# Patient Record
Sex: Female | Born: 1990 | Race: Black or African American | Hispanic: No | Marital: Single | Smoking: Never smoker
Health system: Southern US, Community
[De-identification: ages and names within clinical notes are randomized; demographics above are authoritative.]

---
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
## 2019-04-15 ENCOUNTER — Encounter (HOSPITAL_COMMUNITY): Payer: Self-pay | Admitting: Emergency Medicine

## 2019-04-15 ENCOUNTER — Other Ambulatory Visit: Payer: Self-pay

## 2019-04-15 ENCOUNTER — Emergency Department (HOSPITAL_COMMUNITY): Payer: Medicaid Other

## 2019-04-15 ENCOUNTER — Emergency Department (HOSPITAL_COMMUNITY)
Admission: EM | Admit: 2019-04-15 | Discharge: 2019-04-15 | Disposition: A | Discharge status: Home / Self Care | Payer: Medicaid Other | Attending: Emergency Medicine | Admitting: Emergency Medicine

## 2019-04-15 DIAGNOSIS — S0292XA Unspecified fracture of facial bones, initial encounter for closed fracture: Secondary | ICD-10-CM | POA: Insufficient documentation

## 2019-04-15 DIAGNOSIS — H5713 Ocular pain, bilateral: Secondary | ICD-10-CM | POA: Insufficient documentation

## 2019-04-15 DIAGNOSIS — H11422 Conjunctival edema, left eye: Secondary | ICD-10-CM | POA: Insufficient documentation

## 2019-04-15 DIAGNOSIS — Y929 Unspecified place or not applicable: Secondary | ICD-10-CM | POA: Insufficient documentation

## 2019-04-15 DIAGNOSIS — Y999 Unspecified external cause status: Secondary | ICD-10-CM | POA: Diagnosis not present

## 2019-04-15 DIAGNOSIS — Y939 Activity, unspecified: Secondary | ICD-10-CM | POA: Insufficient documentation

## 2019-04-15 DIAGNOSIS — M542 Cervicalgia: Secondary | ICD-10-CM | POA: Diagnosis not present

## 2019-04-15 DIAGNOSIS — R519 Headache, unspecified: Secondary | ICD-10-CM | POA: Diagnosis present

## 2019-04-15 MED ORDER — FLUORESCEIN SODIUM 1 MG OP STRP
1.0000 | ORAL_STRIP | Freq: Once | OPHTHALMIC | Status: DC
  Filled 2019-04-15: qty 1

## 2019-04-15 MED ORDER — TETRACAINE HCL 0.5 % OP SOLN
1.0000 [drp] | Freq: Once | OPHTHALMIC | Status: DC
  Filled 2019-04-15: qty 4

## 2019-04-15 NOTE — Discharge Instructions (Addendum)
Your CTs today showed a left nasal lacrimal bone fracture.  You need to avoid blowing your nose for the next 2 weeks.  We performed an eye exam in the ED which was within normal limits, you will need to schedule an appointment with ophthalmology along with ENT in order to obtain follow-up for this fracture.
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
## 2019-04-15 NOTE — ED Notes (Signed)
Pt discharge and follow up education provided. Pt verbalizes understanding. Pt is alert and oriented x 4 and ambulatory at discharge.  

## 2019-04-15 NOTE — ED Notes (Signed)
Pt transported to CT ?
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
## 2020-09-23 DIAGNOSIS — D649 Anemia, unspecified: Secondary | ICD-10-CM | POA: Insufficient documentation

## 2020-09-23 DIAGNOSIS — N939 Abnormal uterine and vaginal bleeding, unspecified: Secondary | ICD-10-CM | POA: Diagnosis present

## 2021-02-08 DIAGNOSIS — D500 Iron deficiency anemia secondary to blood loss (chronic): Secondary | ICD-10-CM | POA: Diagnosis present

## 2021-04-30 ENCOUNTER — Observation Stay (HOSPITAL_COMMUNITY)
Admission: EM | Admit: 2021-04-30 | Discharge: 2021-05-02 | Disposition: A | Discharge status: Home / Self Care | Payer: Medicaid Other | Attending: Internal Medicine | Admitting: Internal Medicine

## 2021-04-30 ENCOUNTER — Other Ambulatory Visit: Payer: Self-pay

## 2021-04-30 ENCOUNTER — Encounter (HOSPITAL_COMMUNITY): Payer: Self-pay | Admitting: Emergency Medicine

## 2021-04-30 DIAGNOSIS — Z20822 Contact with and (suspected) exposure to covid-19: Secondary | ICD-10-CM | POA: Insufficient documentation

## 2021-04-30 DIAGNOSIS — D649 Anemia, unspecified: Principal | ICD-10-CM | POA: Insufficient documentation

## 2021-04-30 DIAGNOSIS — D500 Iron deficiency anemia secondary to blood loss (chronic): Secondary | ICD-10-CM | POA: Diagnosis present

## 2021-04-30 DIAGNOSIS — D62 Acute posthemorrhagic anemia: Secondary | ICD-10-CM | POA: Diagnosis present

## 2021-04-30 DIAGNOSIS — R531 Weakness: Secondary | ICD-10-CM | POA: Diagnosis present

## 2021-04-30 DIAGNOSIS — N939 Abnormal uterine and vaginal bleeding, unspecified: Secondary | ICD-10-CM | POA: Diagnosis not present

## 2021-04-30 DIAGNOSIS — E876 Hypokalemia: Secondary | ICD-10-CM | POA: Insufficient documentation

## 2021-04-30 LAB — CBC
HCT: 14.9 % — ABNORMAL LOW (ref 36.0–46.0)
Hemoglobin: 4.4 g/dL — CL (ref 12.0–15.0)
MCH: 30.3 pg (ref 26.0–34.0)
MCHC: 29.5 g/dL — ABNORMAL LOW (ref 30.0–36.0)
MCV: 102.8 fL — ABNORMAL HIGH (ref 80.0–100.0)
Platelets: 145 10*3/uL — ABNORMAL LOW (ref 150–400)
RBC: 1.45 MIL/uL — ABNORMAL LOW (ref 3.87–5.11)
RDW: 23.1 % — ABNORMAL HIGH (ref 11.5–15.5)
WBC: 8 10*3/uL (ref 4.0–10.5)
nRBC: 1 % — ABNORMAL HIGH (ref 0.0–0.2)

## 2021-04-30 LAB — PREPARE RBC (CROSSMATCH)

## 2021-04-30 LAB — URINALYSIS, ROUTINE W REFLEX MICROSCOPIC

## 2021-04-30 LAB — BASIC METABOLIC PANEL
Anion gap: 5 (ref 5–15)
BUN: <5 mg/dL — ABNORMAL LOW (ref 6–20)
CO2: 24 mmol/L (ref 22–32)
Calcium: 8.2 mg/dL — ABNORMAL LOW (ref 8.9–10.3)
Chloride: 106 mmol/L (ref 98–111)
Creatinine, Ser: 0.78 mg/dL (ref 0.44–1.00)
GFR, Estimated: >60 mL/min (ref >60)
Glucose, Bld: 111 mg/dL — ABNORMAL HIGH (ref 70–99)
Potassium: 3.3 mmol/L — ABNORMAL LOW (ref 3.5–5.1)
Sodium: 135 mmol/L (ref 135–145)

## 2021-04-30 LAB — I-STAT BETA HCG BLOOD, ED (MC, WL, AP ONLY): I-stat hCG, quantitative: <5 m[IU]/mL (ref <5)

## 2021-04-30 LAB — RESP PANEL BY RT-PCR (FLU A&B, COVID) ARPGX2
Influenza A by PCR: NEGATIVE
Influenza B by PCR: NEGATIVE
SARS Coronavirus 2 by RT PCR: NEGATIVE

## 2021-04-30 LAB — URINALYSIS, MICROSCOPIC (REFLEX): RBC / HPF: >50 RBC/hpf (ref 0–5)

## 2021-04-30 LAB — ABO/RH: ABO/RH(D): B POS

## 2021-04-30 MED ORDER — ACETAMINOPHEN 325 MG PO TABS: 650.0000 mg | ORAL_TABLET | Freq: Four times a day (QID) | ORAL | Status: DC | PRN

## 2021-04-30 MED ORDER — POTASSIUM CHLORIDE CRYS ER 20 MEQ PO TBCR
40.0000 meq | EXTENDED_RELEASE_TABLET | Freq: Once | ORAL | Status: AC
  Administered 2021-05-01: 40 meq via ORAL
  Filled 2021-04-30: qty 2

## 2021-04-30 MED ORDER — ONDANSETRON HCL 4 MG/2ML IJ SOLN
4.0000 mg | Freq: Once | INTRAMUSCULAR | Status: AC
  Administered 2021-04-30: 4 mg via INTRAVENOUS
  Filled 2021-04-30: qty 2

## 2021-04-30 MED ORDER — POLYETHYLENE GLYCOL 3350 17 G PO PACK
17.0000 g | PACK | Freq: Every day | ORAL | Status: DC | PRN
  Administered 2021-05-01: 17 g via ORAL
  Filled 2021-04-30: qty 1

## 2021-04-30 MED ORDER — SODIUM CHLORIDE 0.9 % IV SOLN: 10.0000 mL/h | Freq: Once | INTRAVENOUS | Status: DC

## 2021-04-30 MED ORDER — MEGESTROL ACETATE 40 MG PO TABS
40.0000 mg | ORAL_TABLET | Freq: Two times a day (BID) | ORAL | Status: DC
  Administered 2021-05-01 – 2021-05-02 (×4): 40 mg via ORAL
  Filled 2021-04-30 (×5): qty 1

## 2021-04-30 MED ORDER — SODIUM CHLORIDE 0.9% FLUSH
3.0000 mL | Freq: Two times a day (BID) | INTRAVENOUS | Status: DC
  Administered 2021-05-01 (×2): 3 mL via INTRAVENOUS

## 2021-04-30 MED ORDER — ACETAMINOPHEN 650 MG RE SUPP: 650.0000 mg | Freq: Four times a day (QID) | RECTAL | Status: DC | PRN

## 2021-04-30 MED ORDER — ACETAMINOPHEN 500 MG PO TABS
1000.0000 mg | ORAL_TABLET | Freq: Once | ORAL | Status: AC
  Administered 2021-05-01: 1000 mg via ORAL
  Filled 2021-04-30: qty 2

## 2021-04-30 MED ORDER — ONDANSETRON HCL 4 MG/2ML IJ SOLN
4.0000 mg | Freq: Four times a day (QID) | INTRAMUSCULAR | Status: AC | PRN
  Administered 2021-05-01 (×2): 4 mg via INTRAVENOUS
  Filled 2021-04-30 (×2): qty 2

## 2021-04-30 NOTE — ED Triage Notes (Addendum)
Pt reported to ED for evaluation of symptoms including weakness, intermitted shortness of breath, nausea and hot flashes/chills and insomnia/restlessness after being prescribed medroxyprogesterone for vaginal bleeding. Pt states she was administered a blood transfusion for excessive bleeding a little over a week ago. Pt states bleeding has decreased since taking medication, but she cannot tolerate the side effects. Pallor noted, skin cool and dry.

## 2021-04-30 NOTE — H&P (Addendum)
History and Physical   Olivia Bruce VXB:[id number] DOB: Oct 06, 1990 DOA: 04/30/2021  PCP: Pcp, No   Patient coming from: Home  Chief Complaint: Multiple complaints, weakness and shortness of breath, history of anemia  HPI: Olivia Bruce is a 31 y.o. female with medical history significant of abnormal uterine bleeding, anemia presenting with weakness and shortness of breath at constellation of other symptoms.  Patient presenting with multiple days of weakness and shortness of breath as well as associated nausea, chills, hot flashes, insomnia, restlessness.  She associates most of the symptoms with recent prescription of medroxyprogesterone for her abnormal uterine bleeding.  She was recently seen for this at the [facility] ED where she was found to have, per chart review, hemoglobin of 7.7 on February 14, this improved on recheck on the 15th to 9.6.  Patient was discharged on the above medroxyprogesterone.  She feels like her bleeding has slowed some on this medication however worried about possible side effects if that is what is causing her symptoms.  She denies fevers, chest pain,abdominal pain, constipation, diarrhea.  ED Course: Vital signs in the ED significant for blood pressure in the 100s to 130s systolic, heart rate in the 90s to 100s, respiratory rate in the teens to 20s.  Lab work-up included BMP with potassium 3.3, calcium 8.2.  CBC showed hemoglobin of 4.4 and platelets of 145.  Respiratory panel for flu and COVID-negative.  Urinalysis was unable to be fully interpreted due to red pigment interference.  RBCs noted on microscopic analysis.  Patient was typed and screened in the ED.  2 units were ordered for transfusion and patient received Zofran and Tylenol.  OB/GYN was consulted who recommended Megace and consideration of transvaginal ultrasound.  Review of Systems: As per HPI otherwise all other systems reviewed and are negative.  History reviewed. No pertinent past medical  history.  History reviewed. No pertinent surgical history.  Social History  reports that she has never smoked. She has never used smokeless tobacco. She reports current alcohol use. She reports current drug use. Drug: Marijuana.  No Known Allergies  History reviewed. No pertinent family history.   Prior to Admission medications   Medication Sig Start Date End Date Taking? Authorizing Provider  acetaminophen (TYLENOL) 500 MG tablet Take 500 mg by mouth every 6 (six) hours as needed for mild pain.    [provider]    Physical Exam: Vitals:   04/30/21 2130 04/30/21 2145 04/30/21 2230 04/30/21 2245  BP: 114/68 120/69 (!) 104/52 (!) 111/58  Pulse: 99 (!) 101 87 93  Resp: 20 (!) 22 (!) 22 (!) 21  Temp:      TempSrc:      SpO2: 100% 100% 100% 100%  Weight:      Height:        Physical Exam Constitutional:      General: She is not in acute distress.    Appearance: Normal appearance.  HENT:     Head: Normocephalic and atraumatic.     Mouth/Throat:     Mouth: Mucous membranes are moist.     Pharynx: Oropharynx is clear.  Eyes:     Extraocular Movements: Extraocular movements intact.     Pupils: Pupils are equal, round, and reactive to light.  Cardiovascular:     Rate and Rhythm: Normal rate and regular rhythm.     Pulses: Normal pulses.     Heart sounds: Normal heart sounds.  Pulmonary:     Effort: Pulmonary effort is normal. No respiratory  distress.     Breath sounds: Normal breath sounds.  Abdominal:     General: Bowel sounds are normal. There is no distension.     Palpations: Abdomen is soft.     Tenderness: There is no abdominal tenderness.  Musculoskeletal:        General: No swelling or deformity.  Skin:    General: Skin is warm and dry.  Neurological:     General: No focal deficit present.     Mental Status: Mental status is at baseline.    Labs on Admission: I have personally reviewed following labs and imaging studies  CBC: Recent Labs  Lab  04/30/21 2008  WBC 8.0  HGB 4.4*  HCT 14.9*  MCV 102.8*  PLT 145*    Basic Metabolic Panel: Recent Labs  Lab 04/30/21 2008  NA 135  K 3.3*  CL 106  CO2 24  GLUCOSE 111*  BUN <5*  CREATININE 0.78  CALCIUM 8.2*    GFR: Estimated Creatinine Clearance: 98 mL/min (by C-G formula based on SCr of 0.78 mg/dL).  Liver Function Tests: No results for input(s): AST, ALT, ALKPHOS, BILITOT, PROT, ALBUMIN in the last 168 hours.  Urine analysis:    Component Value Date/Time   COLORURINE RED (A) 04/30/2021 1843   APPEARANCEUR TURBID (A) 04/30/2021 1843   LABSPEC  04/30/2021 1843    TEST NOT REPORTED DUE TO COLOR INTERFERENCE OF URINE PIGMENT   PHURINE  04/30/2021 1843    TEST NOT REPORTED DUE TO COLOR INTERFERENCE OF URINE PIGMENT   GLUCOSEU (A) 04/30/2021 1843    TEST NOT REPORTED DUE TO COLOR INTERFERENCE OF URINE PIGMENT   HGBUR (A) 04/30/2021 1843    TEST NOT REPORTED DUE TO COLOR INTERFERENCE OF URINE PIGMENT   BILIRUBINUR (A) 04/30/2021 1843    TEST NOT REPORTED DUE TO COLOR INTERFERENCE OF URINE PIGMENT   KETONESUR (A) 04/30/2021 1843    TEST NOT REPORTED DUE TO COLOR INTERFERENCE OF URINE PIGMENT   PROTEINUR (A) 04/30/2021 1843    TEST NOT REPORTED DUE TO COLOR INTERFERENCE OF URINE PIGMENT   NITRITE (A) 04/30/2021 1843    TEST NOT REPORTED DUE TO COLOR INTERFERENCE OF URINE PIGMENT   LEUKOCYTESUR (A) 04/30/2021 1843    TEST NOT REPORTED DUE TO COLOR INTERFERENCE OF URINE PIGMENT    Radiological Exams on Admission: No results found.  EKG: Independently reviewed.  Sinus tachycardia 105 bpm.  No previous to compare.  Assessment/Plan Principal Problem:   Symptomatic anemia Active Problems:   Abnormal uterine bleeding   Hypokalemia   Symptomatic anemia Abnormal uterine bleeding > Patient presenting with weakness and shortness of breath along with a constellation of other symptoms. > Recently seen at [facility] ED for abnormal uterine bleeding with hemoglobin of  7.7 which improved to 9.6 after transfusion of 2 units. > Discharged on medroxyprogesterone they are with recommendation to follow-up with OB/GYN, states her bleeding seems to have improved but was worried about side effects with symptoms she had been having which included weakness, shortness of breath, nausea, chills, hot flashes, insomnia, restless. > Found to have hemoglobin of 4.4 in the ED. > 2 units have been ordered for transfusion > OB/GYN has been consulted and recommend Megace and to consider vaginal/transabdominal ultrasound.  Their office will work to set her up for follow-up outpatient. - Appreciate recommendations left by OB/GYN, unclear if they plan to see the patient while here or does follow-up outpatient - Continue with 2 unit transfusion - Trend CBC,  likely need additional units of transfusion - Megace 40 mg twice daily - Transabdominal/vaginal ultrasound will compared to previous done in July description below (also available in care everywhere):  July Vaginal/Abdominal Ultrasound:  "Very thickened heterogeneous endometrium measuring 23.3 mm thick.  Endometrial tumor cannot be excluded. If bleeding remains  unresponsive to hormonal or medical therapy, focal lesion work-up  with sonohysterogram should be considered. Endometrial biopsy should  also be considered in pre-menopausal patients at high risk for  endometrial carcinoma."  Hypokalemia > Potassium noted to be 3.3 on admission - 40 mEq p.o. repletion - Check magnesium - Trend potassium  DVT prophylaxis: SCDs Code Status:   Full Family Communication:  , Potassium.  States her family is up-to-date. Disposition Plan:   Patient is from:  Home  Anticipated DC to:  Home  Anticipated DC date:  1 to 2 days  Anticipated DC barriers: None  Consults called:  OB/GYN consulted by EDP, gave recommendations, unclear if they will follow. Admission status:  Observation, progressive  Severity of Illness: The appropriate  patient status for this patient is OBSERVATION. Observation status is judged to be reasonable and necessary in order to provide the required intensity of service to ensure the patient's safety. The patient's presenting symptoms, physical exam findings, and initial radiographic and laboratory data in the context of their medical condition is felt to place them at decreased risk for further clinical deterioration. Furthermore, it is anticipated that the patient will be medically stable for discharge from the hospital within 2 midnights of admission.    Synetta Fail MD [facility]  How to contact the [facility] Attending or Consulting provider 7A - 7P or covering provider during after hours 7P -7A, for this patient?   Check the care team in [facility] and look for a) attending/consulting [facility] provider listed and b) the [facility] team listed Log into [email or web address] and use [place]'s universal password to access. If you do not have the password, please contact the hospital operator. Locate the [facility] provider you are looking for under [facility] and page to a number that you can be directly reached. If you still have difficulty reaching the provider, please page the [facility] (Director on Call) for the Hospitalists listed on amion for assistance.  05/01/2021, 12:01 AM

## 2021-04-30 NOTE — ED Provider Notes (Signed)
[facility] EMERGENCY DEPARTMENT Provider Note   CSN: [id number] Arrival date & time: 04/30/21  1843     History  Chief Complaint  Patient presents with   Weakness   Nausea    Olivia Bruce is a 31 y.o. female who presents to the ED today with complaint of gradual onset, constant, achy, headache for the past 1 week.  Patient also complains of nausea and shortness of breath as well as objective fevers and chills.  She states that she believes it is attributed to the medications that she was started on recently during previous ED visit on 2/14.  Per chart review she was seen at [facility] ED for dysfunctional uterine bleeding.  She had a hemoglobin of 7.1 at that time and received 2 units packed red blood cells.  She was started on Provera and advised to follow-up with OB/GYN.  Patient states that since starting the medication she has felt worse.  She does endorse that she is still having vaginal bleeding however it has decreased in amount.  She states that she is only using 1 large pad per day at this time.  She states that she was going through multiple pads last week.  She reports that she has had issues with this in the past.  She reports history of " excess tissue" in her uterus.  Per chart review patient had pelvic ultrasound done in July when she required a blood transfusion related to vaginal bleeding.  She was found to have a severely thickened endometrium at that time.   The history is provided by the patient and medical records.      Home Medications Prior to Admission medications   Medication Sig Start Date End Date Taking? Authorizing Provider  acetaminophen (TYLENOL) 500 MG tablet Take 500 mg by mouth every 6 (six) hours as needed for mild pain.    [provider]      Allergies    Patient has no known allergies.    Review of Systems   Review of Systems  Constitutional:  Positive for chills and fatigue.  Eyes:  Negative for visual disturbance.   Respiratory:  Positive for shortness of breath.   Cardiovascular:  Negative for chest pain.  Gastrointestinal:  Positive for nausea. Negative for vomiting.  Skin:  Positive for pallor.  Neurological:  Positive for headaches.  All other systems reviewed and are negative.  Physical Exam Updated Vital Signs BP (!) 111/58    Pulse 93    Temp 99.4 F (37.4 C) (Oral)    Resp (!) 21    Ht 5\' 3"  (1.6 m)    Wt 70.8 kg    SpO2 100%    BMI 27.63 kg/m  Physical Exam Vitals and nursing note reviewed.  Constitutional:      Appearance: She is not ill-appearing.  HENT:     Head: Normocephalic and atraumatic.  Eyes:     Extraocular Movements: Extraocular movements intact.     Conjunctiva/sclera: Conjunctivae normal.     Pupils: Pupils are equal, round, and reactive to light.  Cardiovascular:     Rate and Rhythm: Regular rhythm. Tachycardia present.  Pulmonary:     Effort: Pulmonary effort is normal.     Breath sounds: Normal breath sounds. No wheezing, rhonchi or rales.  Abdominal:     Palpations: Abdomen is soft.     Tenderness: There is no abdominal tenderness. There is no guarding or rebound.  Genitourinary:    Comments: Mild dark red/brown  blood in vault. No hemorrhage appreciated. No adnexal TTP or CMT on exam.  Musculoskeletal:     Cervical back: Neck supple.  Skin:    General: Skin is warm and dry.  Neurological:     General: No focal deficit present.     Mental Status: She is alert and oriented to person, place, and time.     Cranial Nerves: No cranial nerve deficit.     Motor: No weakness.    ED Results / Procedures / Treatments   Labs (all labs ordered are listed, but only abnormal results are displayed) Labs Reviewed  BASIC METABOLIC PANEL - Abnormal; Notable for the following components:      Result Value   Potassium 3.3 (*)    Glucose, Bld 111 (*)    BUN <5 (*)    Calcium 8.2 (*)    All other components within normal limits  CBC - Abnormal; Notable for the  following components:   RBC 1.45 (*)    Hemoglobin 4.4 (*)    HCT 14.9 (*)    MCV 102.8 (*)    MCHC 29.5 (*)    RDW 23.1 (*)    Platelets 145 (*)    nRBC 1.0 (*)    All other components within normal limits  URINALYSIS, ROUTINE W REFLEX MICROSCOPIC - Abnormal; Notable for the following components:   Color, Urine RED (*)    APPearance TURBID (*)    Glucose, UA   (*)    Value: TEST NOT REPORTED DUE TO COLOR INTERFERENCE OF URINE PIGMENT   Hgb urine dipstick   (*)    Value: TEST NOT REPORTED DUE TO COLOR INTERFERENCE OF URINE PIGMENT   Bilirubin Urine   (*)    Value: TEST NOT REPORTED DUE TO COLOR INTERFERENCE OF URINE PIGMENT   Ketones, ur   (*)    Value: TEST NOT REPORTED DUE TO COLOR INTERFERENCE OF URINE PIGMENT   Protein, ur   (*)    Value: TEST NOT REPORTED DUE TO COLOR INTERFERENCE OF URINE PIGMENT   Nitrite   (*)    Value: TEST NOT REPORTED DUE TO COLOR INTERFERENCE OF URINE PIGMENT   Leukocytes,Ua   (*)    Value: TEST NOT REPORTED DUE TO COLOR INTERFERENCE OF URINE PIGMENT   All other components within normal limits  URINALYSIS, MICROSCOPIC (REFLEX) - Abnormal; Notable for the following components:   Bacteria, UA FIELD OBSCURED BY RBC'S (*)    All other components within normal limits  RESP PANEL BY RT-PCR (FLU A&B, COVID) ARPGX2  HIV ANTIBODY (ROUTINE TESTING W REFLEX)  CBC  BASIC METABOLIC PANEL  MAGNESIUM  I-STAT BETA HCG BLOOD, ED ([facility], [facility], [facility] ONLY)  TYPE AND SCREEN  ABO/RH  PREPARE RBC (CROSSMATCH)    EKG None  Radiology No results found.  Procedures .Critical Care Performed by: Eustaquio Maize, PA-C Authorized by: Eustaquio Maize, PA-C   Critical care provider statement:    Critical care time (minutes):  35   Critical care was time spent personally by me on the following activities:  Development of treatment plan with patient or surrogate, discussions with consultants, evaluation of patient's response to treatment, examination of patient, ordering and  review of laboratory studies, ordering and review of radiographic studies, ordering and performing treatments and interventions, pulse oximetry, re-evaluation of patient's condition and review of old charts    Medications Ordered in ED Medications  0.9 %  sodium chloride infusion (has no administration in time range)  acetaminophen (  TYLENOL) tablet 1,000 mg (has no administration in time range)  sodium chloride flush (NS) 0.9 % injection 3 mL (has no administration in time range)  potassium chloride SA (KLOR-CON M) CR tablet 40 mEq (has no administration in time range)  acetaminophen (TYLENOL) tablet 650 mg (has no administration in time range)    Or  acetaminophen (TYLENOL) suppository 650 mg (has no administration in time range)  polyethylene glycol (MIRALAX / GLYCOLAX) packet 17 g (has no administration in time range)  megestrol (MEGACE) tablet 40 mg (has no administration in time range)  ondansetron (ZOFRAN) injection 4 mg (4 mg Intravenous Given 04/30/21 2143)    ED Course/ Medical Decision Making/ A&P Clinical Course as of 04/30/21 2332  Fri Apr 30, 2021  2326 Megace 40 mg bid  [MV]    Clinical Course User Index [MV] Eustaquio Maize, PA-C                           Medical Decision Making 31 year old female who presents to the ED today with complaint of headache, nausea, shortness of breath, fatigue, subjective fevers/chills over the past week since starting Provera secondary to heavy vaginal bleeding.  Received 2 units packed red blood cells last week at [facility] ED after hemoglobin returned at 7.1.  On arrival to the ED today patient is afebrile, mildly tachycardic in the low 110s, nontachypneic.  She does appear pale on exam at this time.  She has no abdominal tenderness palpation on exam.  She reports that she continues to bleed x5 weeks however it has slightly decreased in severity since starting Provera.  She has no focal neurodeficits on exam.  She had lab work done while in  the waiting room which did show a hemoglobin of 4.4.  Concerned that this is causing symptoms at this time however could consider medication reaction from starting Provera.  Ultimately patient will need to be admitted for transfusion.  We will plan for pelvic exam given vaginal bleeding and consultation with OB/GYN.  Problems Addressed: Abnormal uterine bleeding: acute illness or injury Symptomatic anemia: acute illness or injury  Amount and/or Complexity of Data Reviewed Labs: ordered. Discussion of management or test interpretation with external provider(s): Discussed case with [facility] Dr. Trilby Drummer who agrees to accept patient for admission.   Dr. Rip Harbour with OBGYN recommends 40 mg Megace BID instead of provera. Will get her set up in the outpatient setting with OBGYN. +/- pelvic ultrasound at the discretion of the hospitalist.   Risk OTC drugs. Prescription drug management. Decision regarding hospitalization.  Critical Care Total time providing critical care: 30-74 minutes         Final Clinical Impression(s) / ED Diagnoses Final diagnoses:  Symptomatic anemia  Abnormal uterine bleeding    Rx / DC Orders ED Discharge Orders     None         Eustaquio Maize, PA-C 04/30/21 2332    Tegeler, Gwenyth Allegra, MD 04/30/21 [phone]

## 2021-05-01 ENCOUNTER — Encounter (HOSPITAL_COMMUNITY): Payer: Self-pay | Admitting: Internal Medicine

## 2021-05-01 ENCOUNTER — Observation Stay (HOSPITAL_COMMUNITY): Payer: Medicaid Other

## 2021-05-01 DIAGNOSIS — N939 Abnormal uterine and vaginal bleeding, unspecified: Secondary | ICD-10-CM | POA: Diagnosis not present

## 2021-05-01 DIAGNOSIS — E876 Hypokalemia: Secondary | ICD-10-CM | POA: Diagnosis not present

## 2021-05-01 DIAGNOSIS — D500 Iron deficiency anemia secondary to blood loss (chronic): Secondary | ICD-10-CM

## 2021-05-01 DIAGNOSIS — D62 Acute posthemorrhagic anemia: Secondary | ICD-10-CM | POA: Diagnosis not present

## 2021-05-01 DIAGNOSIS — D649 Anemia, unspecified: Secondary | ICD-10-CM | POA: Diagnosis not present

## 2021-05-01 LAB — IRON AND TIBC
Iron: 191 ug/dL — ABNORMAL HIGH (ref 28–170)
Saturation Ratios: 79 % — ABNORMAL HIGH (ref 10.4–31.8)
TIBC: 242 ug/dL — ABNORMAL LOW (ref 250–450)
UIBC: 51 ug/dL

## 2021-05-01 LAB — BASIC METABOLIC PANEL
Anion gap: 6 (ref 5–15)
BUN: 5 mg/dL — ABNORMAL LOW (ref 6–20)
CO2: 22 mmol/L (ref 22–32)
Calcium: 7.9 mg/dL — ABNORMAL LOW (ref 8.9–10.3)
Chloride: 108 mmol/L (ref 98–111)
Creatinine, Ser: 1.03 mg/dL — ABNORMAL HIGH (ref 0.44–1.00)
GFR, Estimated: >60 mL/min (ref >60)
Glucose, Bld: 92 mg/dL (ref 70–99)
Potassium: 3.7 mmol/L (ref 3.5–5.1)
Sodium: 136 mmol/L (ref 135–145)

## 2021-05-01 LAB — RETICULOCYTES
Immature Retic Fract: 31.1 % — ABNORMAL HIGH (ref 2.3–15.9)
RBC.: 2.66 MIL/uL — ABNORMAL LOW (ref 3.87–5.11)
Retic Count, Absolute: 248.4 10*3/uL — ABNORMAL HIGH (ref 19.0–186.0)
Retic Ct Pct: 9.3 % — ABNORMAL HIGH (ref 0.4–3.1)

## 2021-05-01 LAB — HEMOGLOBIN AND HEMATOCRIT, BLOOD
HCT: 24.9 % — ABNORMAL LOW (ref 36.0–46.0)
Hemoglobin: 8.2 g/dL — ABNORMAL LOW (ref 12.0–15.0)

## 2021-05-01 LAB — HIV ANTIBODY (ROUTINE TESTING W REFLEX): HIV Screen 4th Generation wRfx: NONREACTIVE

## 2021-05-01 LAB — CBC
HCT: 17.8 % — ABNORMAL LOW (ref 36.0–46.0)
Hemoglobin: 5.4 g/dL — CL (ref 12.0–15.0)
MCH: 30.9 pg (ref 26.0–34.0)
MCHC: 30.3 g/dL (ref 30.0–36.0)
MCV: 101.7 fL — ABNORMAL HIGH (ref 80.0–100.0)
Platelets: 136 10*3/uL — ABNORMAL LOW (ref 150–400)
RBC: 1.75 MIL/uL — ABNORMAL LOW (ref 3.87–5.11)
RDW: 21.1 % — ABNORMAL HIGH (ref 11.5–15.5)
WBC: 6 10*3/uL (ref 4.0–10.5)
nRBC: 1.3 % — ABNORMAL HIGH (ref 0.0–0.2)

## 2021-05-01 LAB — FOLATE: Folate: 11.6 ng/mL (ref >5.9)

## 2021-05-01 LAB — PREPARE RBC (CROSSMATCH)

## 2021-05-01 LAB — MRSA NEXT GEN BY PCR, NASAL: MRSA by PCR Next Gen: NOT DETECTED

## 2021-05-01 LAB — FERRITIN: Ferritin: 55 ng/mL (ref 11–307)

## 2021-05-01 LAB — MAGNESIUM: Magnesium: 2.1 mg/dL (ref 1.7–2.4)

## 2021-05-01 LAB — VITAMIN B12: Vitamin B-12: 391 pg/mL (ref 180–914)

## 2021-05-01 MED ORDER — ONDANSETRON HCL 4 MG/2ML IJ SOLN
INTRAMUSCULAR | Status: AC
  Filled 2021-05-01: qty 2

## 2021-05-01 MED ORDER — SODIUM CHLORIDE 0.9% IV SOLUTION: Freq: Once | INTRAVENOUS | Status: AC

## 2021-05-01 NOTE — Assessment & Plan Note (Signed)
Transfuse to keep hemoglobin greater than 7, so far 3 units of PRBC transfusions ordered.  Repeat H&H tonight.

## 2021-05-01 NOTE — ED Notes (Signed)
Lunch tray at the bedside. Pt stated she want to rest at the moment

## 2021-05-01 NOTE — Assessment & Plan Note (Signed)
Anemia panel ordered. Transfuse to keep hemoglobin greater than 7.

## 2021-05-01 NOTE — Assessment & Plan Note (Signed)
Replaced, repeat in the morning

## 2021-05-01 NOTE — Assessment & Plan Note (Signed)
Secondary to uterine bleeding. OB/GYN consulted by EDP Trans vaginal ultrasound done and results reviewed with the patient S/p 3 units of PRBC transfusion and repeat H&H ordered for tonight. Transfuse to keep hemoglobin greater than 7. OB/GYN recommended Megace 40 mg twice daily and outpatient clinic follow-up.

## 2021-05-01 NOTE — TOC Initial Note (Signed)
Transition of Care [facility]) - Initial/Assessment Note    Patient Details  Name: Olivia Bruce MRN: [id number] Date of Birth: January 02, 1991  Transition of Care [facility]) CM/SW Contact:    Lockie Pares, RN Phone Number: 05/01/2021, 10:44 AM  Clinical Narrative:                  Transition of Care Department [facility]) has reviewed patient and no TOC needs have been identified at this time. We will continue to monitor patient advancement through interdisciplinary progression rounds. If new patient transition needs arise, please place a TOC consult.           Patient Goals and CMS Choice        Expected Discharge Plan and Services                                                Prior Living Arrangements/Services                       Activities of Daily Living      Permission Sought/Granted                  Emotional Assessment              Admission diagnosis:  Symptomatic anemia [D64.9] Patient Active Problem List   Diagnosis Date Noted   Symptomatic anemia 04/30/2021   Hypokalemia 04/30/2021   Iron deficiency anemia due to chronic blood loss 02/08/2021   Abnormal uterine bleeding 09/23/2020   Anemia 09/23/2020   PCP:  Pcp, No Pharmacy:   [facility] - [place], [place] - [address] [place] [address] Phone: [phone] Fax: [phone]     Social Determinants of Health (SDOH) Interventions    Readmission Risk Interventions No flowsheet data found.

## 2021-05-01 NOTE — ED Notes (Signed)
Pt ambulated to restroom with steady gait.

## 2021-05-01 NOTE — Hospital Course (Signed)
Olivia Bruce is a 31 y.o. female with medical history significant of abnormal uterine bleeding, anemia presenting with weakness and shortness of breath , was found to have hemoglobin of 4.4. she reports being seen  for this at the [facility] ED where she was found to have, per chart review, hemoglobin of 7.7 on February 14, this improved on recheck on the 15th to 9.6.  Patient was discharged on the above medroxyprogesterone.

## 2021-05-01 NOTE — Progress Notes (Signed)
HOSPITAL MEDICINE OVERNIGHT EVENT NOTE     Nursing has notified me that a CBC was performed in between the first and second unit of the patient's 2 unit blood transfusion.  This revealed that the hemoglobin rose from 4.4 to 5.4.  Furthermore, the patient has continued to exhibit a significant amount of bleeding.  It is very unlikely that 1 additional unit will achieve a hemoglobin of greater than 7 at this rate and therefore I am ordering a third unit of blood to be transfused after the second is complete.  We will additionally order repeat hemoglobin and hematocrit after this third unit is completed.  Nursing reports patient is hemodynamically stable with no chest pain or shortness of breath.  Continue to monitor closely.  Vernelle Emerald  MD [facility]

## 2021-05-01 NOTE — Progress Notes (Signed)
[facility]                                                                               Olivia Bruce, is a 31 y.o. female, DOB - 1990/09/26, EQA:[id number] Admit date - 04/30/2021    Outpatient Primary MD for the patient is Pcp, No  LOS - 0  days    Brief summary   Olivia Bruce is a 31 y.o. female with medical history significant of abnormal uterine bleeding, anemia presenting with weakness and shortness of breath , was found to have hemoglobin of 4.4. she reports being seen  for this at the [facility] ED where she was found to have, per chart review, hemoglobin of 7.7 on February 14, this improved on recheck on the 15th to 9.6.  Patient was discharged on the above medroxyprogesterone.     Assessment & Plan    Assessment and Plan: * Symptomatic anemia- (present on admission) Transfuse to keep hemoglobin greater than 7, so far 3 units of PRBC transfusions ordered.  Repeat H&H tonight.  Acute blood loss anemia- (present on admission) Secondary to uterine bleeding. OB/GYN consulted by EDP Trans vaginal ultrasound done and results reviewed with the patient S/p 3 units of PRBC transfusion and repeat H&H ordered for tonight. Transfuse to keep hemoglobin greater than 7. OB/GYN recommended Megace 40 mg twice daily and outpatient clinic follow-up.  Iron deficiency anemia due to chronic blood loss- (present on admission) Anemia panel ordered. Transfuse to keep hemoglobin greater than 7.  Hypokalemia Replaced, repeat in the morning         Estimated body mass index is 27.63 kg/m as calculated from the following:   Height as of this encounter: 5\' 3"  (1.6 m).   Weight as of this encounter: 70.8 kg.  Code Status: Full code DVT Prophylaxis:  SCDs Start: 04/30/21 2325   Level of Care: Level of care: Progressive Family Communication: None at bedside  Disposition Plan:     Remains inpatient appropriate: PRBC transfusions  Procedures:  None  Consultants:    OB/GYN  Antimicrobials:   Anti-infectives (From admission, onward)   None       Medications  Scheduled Meds:  megestrol  40 mg Oral BID   ondansetron       sodium chloride flush  3 mL Intravenous Q12H   Continuous Infusions:  sodium chloride Stopped (05/01/21 0002)   PRN Meds:.acetaminophen **OR** acetaminophen, polyethylene glycol    Subjective:   Olivia Bruce was seen and examined today.  Wants to go home. No new complaints.   Objective:   Vitals:   05/01/21 1200 05/01/21 1247 05/01/21 1300 05/01/21 1400  BP: 123/88 123/88 116/79 114/86  Pulse: 89 77 80 82  Resp: (!) 21 19 15 12   Temp:  99.4 F (37.4 C)    TempSrc:  Oral    SpO2: 100% 100% 100% 100%  Weight:      Height:        Intake/Output Summary (Last 24 hours) at 05/01/2021 1555 Last data filed at 05/01/2021 1334 Gross per 24 hour  Intake 1399.58 ml  Output --  Net 1399.58 ml   Filed Weights   04/30/21 1953  Weight: 70.8 kg     Exam  General: Alert and oriented x 3, NAD  Cardiovascular: S1 S2 auscultated, no murmurs, RRR  Respiratory: Clear to auscultation bilaterally, no wheezing, rales or rhonchi  Gastrointestinal: Soft, nontender, nondistended, + bowel sounds  Ext: no pedal edema bilaterally  Neuro: AAOx3, Cr N's II- XII. Strength 5/5 upper and lower extremities bilaterally  Skin: No rashes  Psych: Normal affect and demeanor, alert and oriented x3    Data Reviewed:  I have personally reviewed following labs and imaging studies   CBC Lab Results  Component Value Date   WBC 6.0 05/01/2021   RBC 1.75 (L) 05/01/2021   HGB 5.4 (LL) 05/01/2021   HCT 17.8 (L) 05/01/2021   MCV 101.7 (H) 05/01/2021   MCH 30.9 05/01/2021   PLT 136 (L) 05/01/2021   MCHC 30.3 05/01/2021   RDW 21.1 (H) 05/01/2021     Last metabolic panel Lab Results  Component Value Date   NA 136 05/01/2021   K 3.7 05/01/2021   CL 108 05/01/2021   CO2 22 05/01/2021   BUN 5 (L) 05/01/2021    CREATININE 1.03 (H) 05/01/2021   GLUCOSE 92 05/01/2021   GFRNONAA >60 05/01/2021   CALCIUM 7.9 (L) 05/01/2021   ANIONGAP 6 05/01/2021    CBG (last 3)  No results for input(s): GLUCAP in the last 72 hours.    Coagulation Profile: No results for input(s): INR, PROTIME in the last 168 hours.   Radiology Studies: US PELVIC COMPLETE WITH TRANSVAGINAL  Result Date: 05/01/2021 CLINICAL DATA:  Abnormal uterine.  Unknown LMP. EXAM: TRANSABDOMINAL AND TRANSVAGINAL ULTRASOUND OF PELVIS TECHNIQUE: Both transabdominal and transvaginal ultrasound examinations of the pelvis were performed. Transabdominal technique was performed for global imaging of the pelvis including uterus, ovaries, adnexal regions, and pelvic cul-de-sac. It was necessary to proceed with endovaginal exam following the transabdominal exam to visualize the endometrium. COMPARISON:  10/22/2020 FINDINGS: Uterus Measurements: 9.4 x 5.3 x 5.7 cm = volume: 148 mL. The uterus is anteverted. No intrauterine masses are seen. The cervix is unremarkable. Endometrium Thickness: 7 mm.  No focal abnormality visualized. Right ovary Measurements: 4.0 x 2.9 x 2.6 cm = volume: 16 mL. Normal appearance/no adnexal mass. Left ovary Measurements: 3.7 x 1.9 x 2.2 cm = volume: 8 mL. Normal appearance/no adnexal mass. Other findings Trace free fluid is seen within the cul-de-sac, possibly physiologic. IMPRESSION: Normal pelvic sonogram. Electronically Signed   By: Helyn Numbers M.D.   On: 05/01/2021 00:47       Kathlen Mody M.D. [facility] 05/01/2021, 3:55 PM  Available via Epic secure chat 7am-7pm After 7 pm, please refer to night coverage provider listed on amion.

## 2021-05-01 NOTE — ED Notes (Signed)
Pt requested BSC, pads and mesh underwear. RN will provide those items for pt.

## 2021-05-01 NOTE — ED Notes (Signed)
Breakfast Order Placed ?

## 2021-05-01 NOTE — ED Notes (Signed)
Pt at bedside eating breakfast.  

## 2021-05-02 DIAGNOSIS — N939 Abnormal uterine and vaginal bleeding, unspecified: Secondary | ICD-10-CM | POA: Diagnosis not present

## 2021-05-02 DIAGNOSIS — D649 Anemia, unspecified: Secondary | ICD-10-CM | POA: Diagnosis not present

## 2021-05-02 DIAGNOSIS — D62 Acute posthemorrhagic anemia: Secondary | ICD-10-CM | POA: Diagnosis not present

## 2021-05-02 LAB — CBC
HCT: 24.5 % — ABNORMAL LOW (ref 36.0–46.0)
Hemoglobin: 7.8 g/dL — ABNORMAL LOW (ref 12.0–15.0)
MCH: 29.3 pg (ref 26.0–34.0)
MCHC: 31.8 g/dL (ref 30.0–36.0)
MCV: 92.1 fL (ref 80.0–100.0)
Platelets: 121 10*3/uL — ABNORMAL LOW (ref 150–400)
RBC: 2.66 MIL/uL — ABNORMAL LOW (ref 3.87–5.11)
RDW: 20.6 % — ABNORMAL HIGH (ref 11.5–15.5)
WBC: 12.2 10*3/uL — ABNORMAL HIGH (ref 4.0–10.5)
nRBC: 1.1 % — ABNORMAL HIGH (ref 0.0–0.2)

## 2021-05-02 LAB — BPAM RBC
Blood Product Expiration Date: 202303132359
Blood Product Expiration Date: 202303162359
Blood Product Expiration Date: 202303172359
ISSUE DATE / TIME: 202302250124
ISSUE DATE / TIME: 202302250456
ISSUE DATE / TIME: 202302250906
Unit Type and Rh: 7300
Unit Type and Rh: 7300
Unit Type and Rh: 9500

## 2021-05-02 LAB — TYPE AND SCREEN
ABO/RH(D): B POS
Antibody Screen: POSITIVE
DAT, IgG: POSITIVE
Donor AG Type: NEGATIVE
Donor AG Type: NEGATIVE
Donor AG Type: NEGATIVE
Unit division: 0
Unit division: 0
Unit division: 0

## 2021-05-02 MED ORDER — VITAMIN B-12 1000 MCG PO TABS: 1000.0000 ug | ORAL_TABLET | Freq: Every day | ORAL | 1 refills | Status: AC

## 2021-05-02 MED ORDER — MEGESTROL ACETATE 40 MG PO TABS
40.0000 mg | ORAL_TABLET | Freq: Two times a day (BID) | ORAL | 0 refills | Status: AC
Start: 2021-05-02 — End: ?

## 2021-05-02 MED ORDER — FERROUS SULFATE 325 (65 FE) MG PO TABS: 325.0000 mg | ORAL_TABLET | Freq: Two times a day (BID) | ORAL | 11 refills | Status: AC

## 2021-05-02 MED ORDER — MEGESTROL ACETATE 40 MG PO TABS: 40.0000 mg | ORAL_TABLET | Freq: Two times a day (BID) | ORAL | 0 refills | Status: DC

## 2021-05-02 MED ORDER — VITAMIN B-12 1000 MCG PO TABS: 1000.0000 ug | ORAL_TABLET | Freq: Every day | ORAL | 1 refills | Status: DC

## 2021-05-02 MED ORDER — FERROUS SULFATE 325 (65 FE) MG PO TABS: 325.0000 mg | ORAL_TABLET | Freq: Two times a day (BID) | ORAL | 11 refills | Status: DC

## 2021-05-02 NOTE — Discharge Summary (Signed)
Physician Discharge Summary   Patient: Olivia Bruce MRN: [id number] DOB: 07-11-1990  Admit date:     04/30/2021  Discharge date: 05/02/21  Discharge Physician: Kathlen Mody   PCP: Pcp, No   Recommendations at discharge:  Please follow up with PCP in one week.  Please follow up with OB/GYN in one week.  Please check CBC and BMP tomorrow and in one week.   Discharge Diagnoses: Principal Problem:   Symptomatic anemia Active Problems:   Abnormal uterine bleeding   Iron deficiency anemia due to chronic blood loss   Acute blood loss anemia   Hypokalemia    Hospital Course: Olivia Bruce is a 31 y.o. female with medical history significant of abnormal uterine bleeding, anemia presenting with weakness and shortness of breath , was found to have hemoglobin of 4.4. she reports being seen  for this at the [facility] ED where she was found to have, per chart review, hemoglobin of 7.7 on February 14, this improved on recheck on the 15th to 9.6.    Assessment and Plan: * Symptomatic anemia- (present on admission) Transfuse to keep hemoglobin greater than 7, so far 3 units of PRBC transfusions ordered.   Hemoglobin 7.8.   Acute blood loss anemia- (present on admission) Secondary to uterine bleeding. Pt reports the bleeding had stopped.  OB/GYN consulted by EDP Trans vaginal ultrasound done and results reviewed with the patient S/p 3 units of PRBC transfusion and repeat H&H ordered and is stable around 7.8. Transfuse to keep hemoglobin greater than 7. OB/GYN recommended Megace 40 mg twice daily and outpatient clinic follow-up.  Iron deficiency anemia due to chronic blood loss- (present on admission) Anemia panel ordered. Transfuse to keep hemoglobin greater than 7.  Hypokalemia Replaced,            Consultants: ob/gyn Procedures performed: none.   Disposition: Home Diet recommendation:  Discharge Diet Orders (From admission, onward)     Start     Ordered   05/02/21 0000   Diet - low sodium heart healthy        05/02/21 1037           Regular diet  DISCHARGE MEDICATION: Allergies as of 05/02/2021   No Known Allergies      Medication List     STOP taking these medications    medroxyPROGESTERone 5 MG tablet Commonly known as: PROVERA       TAKE these medications    acetaminophen 500 MG tablet Commonly known as: TYLENOL Take 500 mg by mouth every 6 (six) hours as needed for mild pain.   ferrous sulfate 325 (65 FE) MG tablet Take 1 tablet (325 mg total) by mouth 2 (two) times daily with a meal.   megestrol 40 MG tablet Commonly known as: MEGACE Take 1 tablet (40 mg total) by mouth 2 (two) times daily.   vitamin B-12 1000 MCG tablet Commonly known as: CYANOCOBALAMIN Take 1 tablet (1,000 mcg total) by mouth daily.         Discharge Exam: Filed Weights   04/30/21 1953  Weight: 70.8 kg   General exam: Appears calm and comfortable  Respiratory system: Clear to auscultation. Respiratory effort normal. Cardiovascular system: S1 & S2 heard, RRR. No JVD,  No pedal edema. Gastrointestinal system: Abdomen is nondistended, soft and nontender.  Normal bowel sounds heard. Central nervous system: Alert and oriented. No focal neurological deficits. Extremities: Symmetric 5 x 5 power. Skin: No rashes, lesions or ulcers Psychiatry: Judgement and insight appear normal. Mood &  affect appropriate.    Condition at discharge: good  The results of significant diagnostics from this hospitalization (including imaging, microbiology, ancillary and laboratory) are listed below for reference.   Imaging Studies: US PELVIC COMPLETE WITH TRANSVAGINAL  Result Date: 05/01/2021 CLINICAL DATA:  Abnormal uterine.  Unknown LMP. EXAM: TRANSABDOMINAL AND TRANSVAGINAL ULTRASOUND OF PELVIS TECHNIQUE: Both transabdominal and transvaginal ultrasound examinations of the pelvis were performed. Transabdominal technique was performed for global imaging of the pelvis  including uterus, ovaries, adnexal regions, and pelvic cul-de-sac. It was necessary to proceed with endovaginal exam following the transabdominal exam to visualize the endometrium. COMPARISON:  10/22/2020 FINDINGS: Uterus Measurements: 9.4 x 5.3 x 5.7 cm = volume: 148 mL. The uterus is anteverted. No intrauterine masses are seen. The cervix is unremarkable. Endometrium Thickness: 7 mm.  No focal abnormality visualized. Right ovary Measurements: 4.0 x 2.9 x 2.6 cm = volume: 16 mL. Normal appearance/no adnexal mass. Left ovary Measurements: 3.7 x 1.9 x 2.2 cm = volume: 8 mL. Normal appearance/no adnexal mass. Other findings Trace free fluid is seen within the cul-de-sac, possibly physiologic. IMPRESSION: Normal pelvic sonogram. Electronically Signed   By: Helyn Numbers M.D.   On: 05/01/2021 00:47    Microbiology: Results for orders placed or performed during the hospital encounter of 04/30/21  Resp Panel by RT-PCR (Flu A&B, Covid) Nasopharyngeal Swab     Status: None   Collection Time: 04/30/21  9:30 PM   Specimen: Nasopharyngeal Swab; Nasopharyngeal(NP) swabs in vial transport medium  Result Value Ref Range Status   SARS Coronavirus 2 by RT PCR NEGATIVE NEGATIVE Final    Comment: (NOTE) SARS-CoV-2 target nucleic acids are NOT DETECTED.  The SARS-CoV-2 RNA is generally detectable in upper respiratory specimens during the acute phase of infection. The lowest concentration of SARS-CoV-2 viral copies this assay can detect is 138 copies/mL. A negative result does not preclude SARS-Cov-2 infection and should not be used as the sole basis for treatment or other patient management decisions. A negative result may occur with  improper specimen collection/handling, submission of specimen other than nasopharyngeal swab, presence of viral mutation(s) within the areas targeted by this assay, and inadequate number of viral copies(<138 copies/mL). A negative result must be combined with clinical observations,  patient history, and epidemiological information. The expected result is Negative.  Fact Sheet for Patients:  BloggerCourse.com  Fact Sheet for Healthcare Providers:  SeriousBroker.it  This test is no t yet approved or cleared by the [place] FDA and  has been authorized for detection and/or diagnosis of SARS-CoV-2 by FDA under an Emergency Use Authorization (EUA). This EUA will remain  in effect (meaning this test can be used) for the duration of the COVID-19 declaration under Section 564(b)(1) of the Act, 21 U.S.C.section 360bbb-3(b)(1), unless the authorization is terminated  or revoked sooner.       Influenza A by PCR NEGATIVE NEGATIVE Final   Influenza B by PCR NEGATIVE NEGATIVE Final    Comment: (NOTE) The Xpert Xpress SARS-CoV-2/FLU/RSV plus assay is intended as an aid in the diagnosis of influenza from Nasopharyngeal swab specimens and should not be used as a sole basis for treatment. Nasal washings and aspirates are unacceptable for Xpert Xpress SARS-CoV-2/FLU/RSV testing.  Fact Sheet for Patients: BloggerCourse.com  Fact Sheet for Healthcare Providers: SeriousBroker.it  This test is not yet approved or cleared by the [place] FDA and has been authorized for detection and/or diagnosis of SARS-CoV-2 by FDA under an Emergency Use Authorization (EUA). This EUA will  remain in effect (meaning this test can be used) for the duration of the COVID-19 declaration under Section 564(b)(1) of the Act, 21 U.S.C. section 360bbb-3(b)(1), unless the authorization is terminated or revoked.  Performed at [facility], [address], [place], [place] [address]   MRSA Next Gen by PCR, Nasal     Status: None   Collection Time: 05/01/21  3:38 PM   Specimen: Nasal Mucosa; Nasal Swab  Result Value Ref Range Status   MRSA by PCR Next Gen NOT DETECTED NOT DETECTED Final     Comment: (NOTE) The GeneXpert MRSA Assay (FDA approved for NASAL specimens only), is one component of a comprehensive MRSA colonization surveillance program. It is not intended to diagnose MRSA infection nor to guide or monitor treatment for MRSA infections. Test performance is not FDA approved in patients less than 61 years old. Performed at [facility], [address], [place], [place] [address]     Labs: CBC: Recent Labs  Lab 04/30/21 2008 05/01/21 0420 05/01/21 1604 05/02/21 0139  WBC 8.0 6.0  --  12.2*  HGB 4.4* 5.4* 8.2* 7.8*  HCT 14.9* 17.8* 24.9* 24.5*  MCV 102.8* 101.7*  --  92.1  PLT 145* 136*  --  121*   Basic Metabolic Panel: Recent Labs  Lab 04/30/21 2008 05/01/21 0420  NA 135 136  K 3.3* 3.7  CL 106 108  CO2 24 22  GLUCOSE 111* 92  BUN <5* 5*  CREATININE 0.78 1.03*  CALCIUM 8.2* 7.9*  MG  --  2.1   Liver Function Tests: No results for input(s): AST, ALT, ALKPHOS, BILITOT, PROT, ALBUMIN in the last 168 hours. CBG: No results for input(s): GLUCAP in the last 168 hours.  Discharge time spent: 38 minutes.   Signed: Kathlen Mody, MD [facility] 05/02/2021

## 2021-05-02 NOTE — Plan of Care (Signed)

## 2021-05-06 ENCOUNTER — Encounter: Payer: Medicaid Other | Admitting: Obstetrics & Gynecology

## 2022-08-30 IMAGING — US US PELVIS COMPLETE WITH TRANSVAGINAL
1 series · 14 of 25 positions shown · non-contrast
Comparison: 10/22/2020

CLINICAL DATA: Abnormal uterine.  Unknown LMP.



[Series 1: us pelvic complete with transvaginal · 14 of 40 slices shown]
[im 1/40]
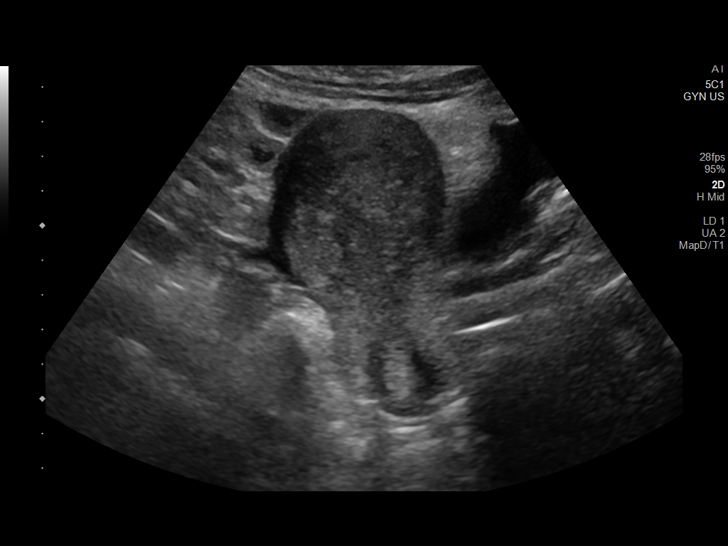
[im 4/40]
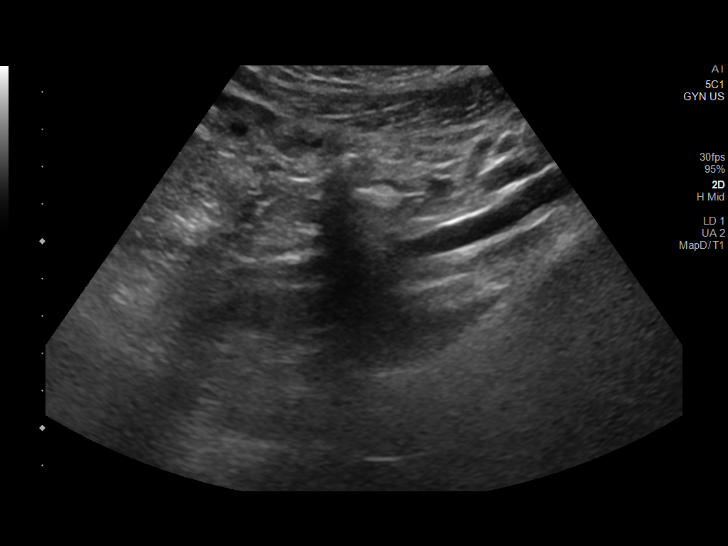
[im 7/40]
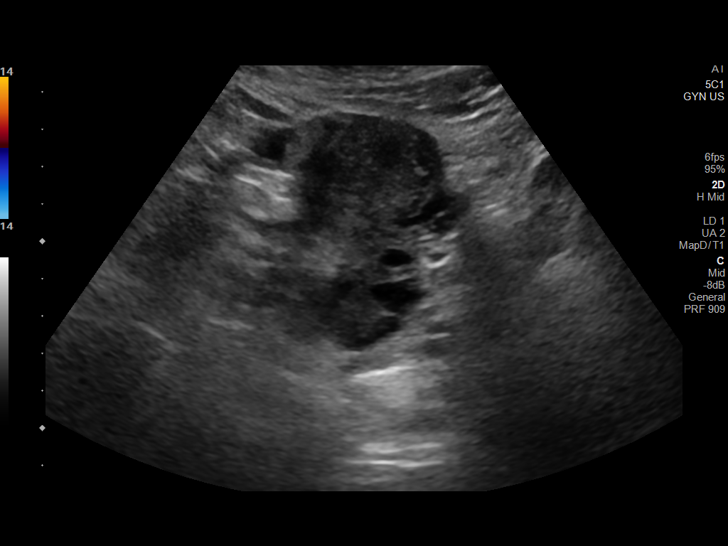
[im 10/40]
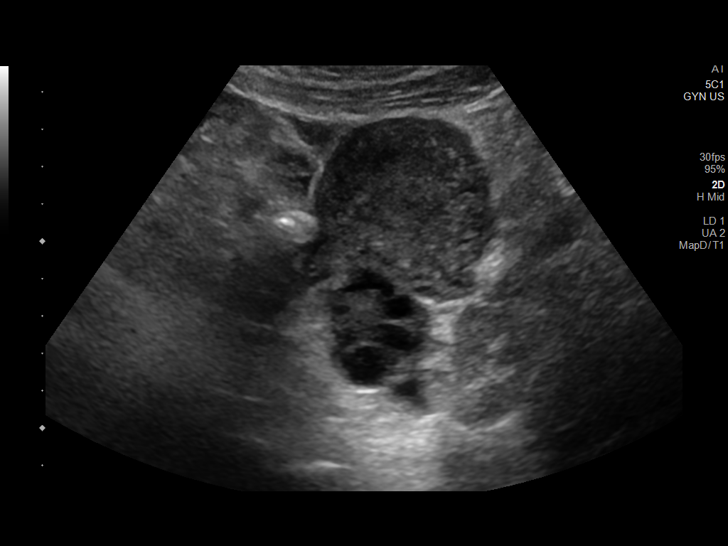
[im 14/40]
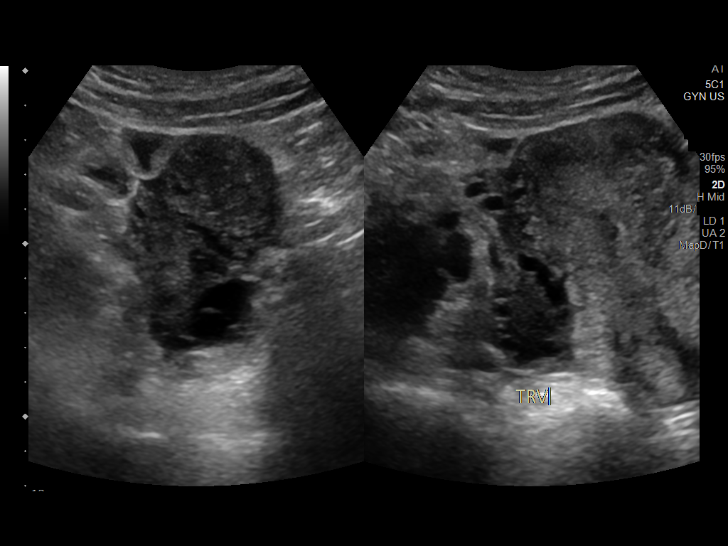
[im 15/40]
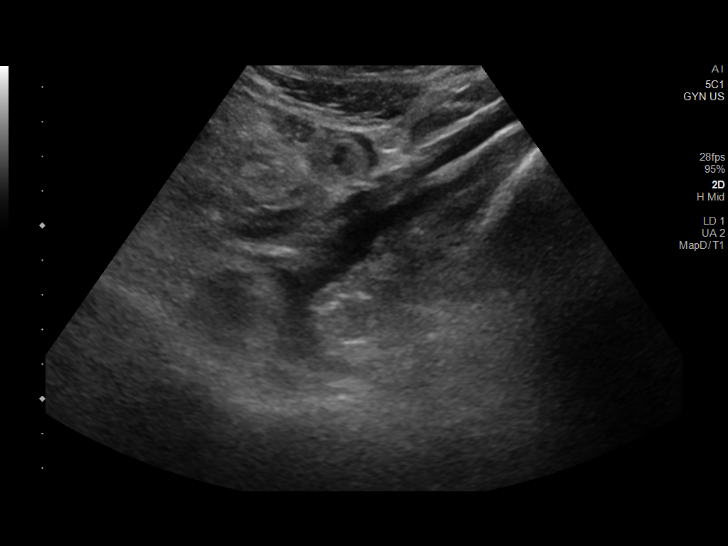
[im 18/40]
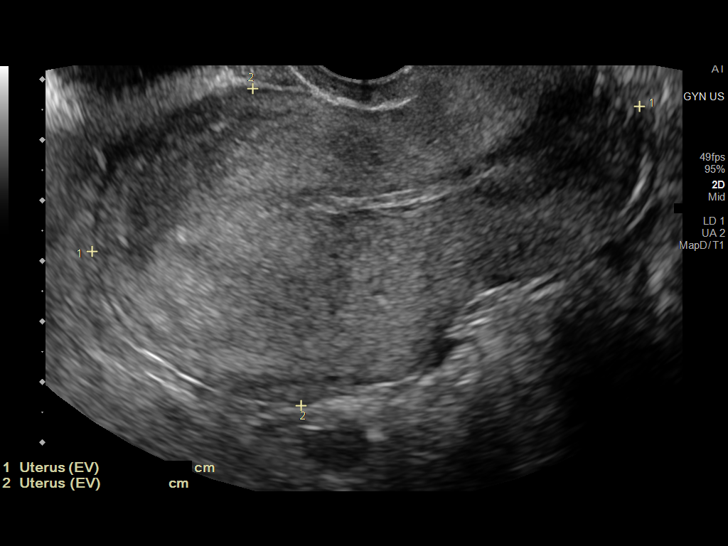
[im 22/40]
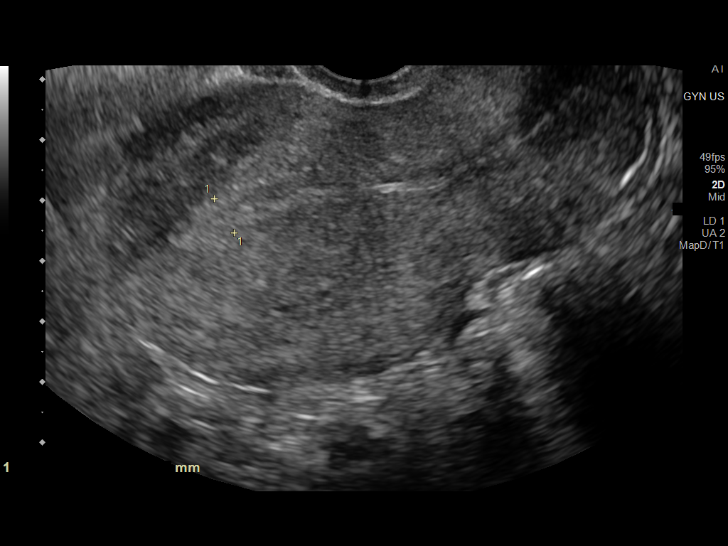
[im 25/40]
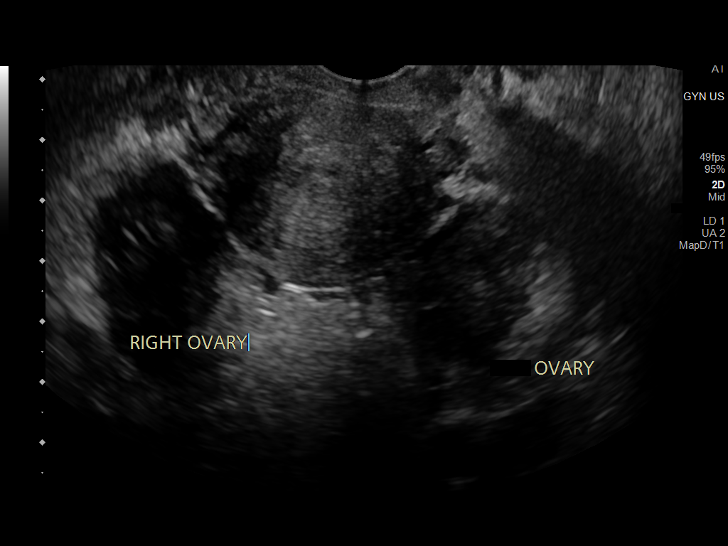
[im 27/40]
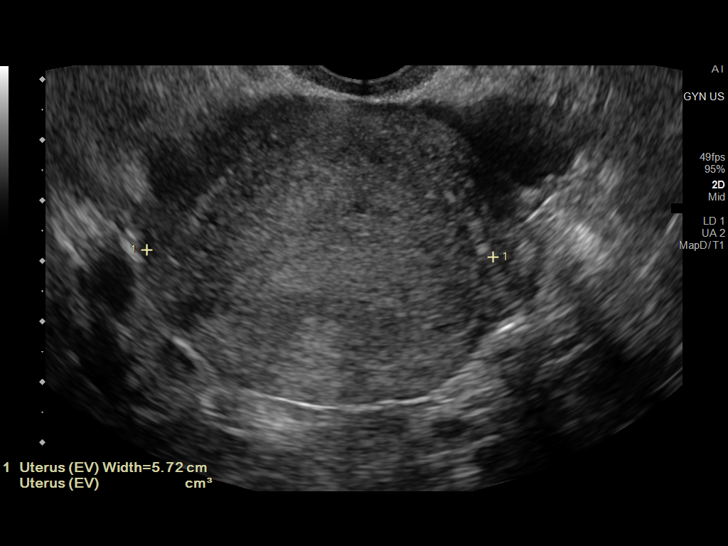
[im 30/40]
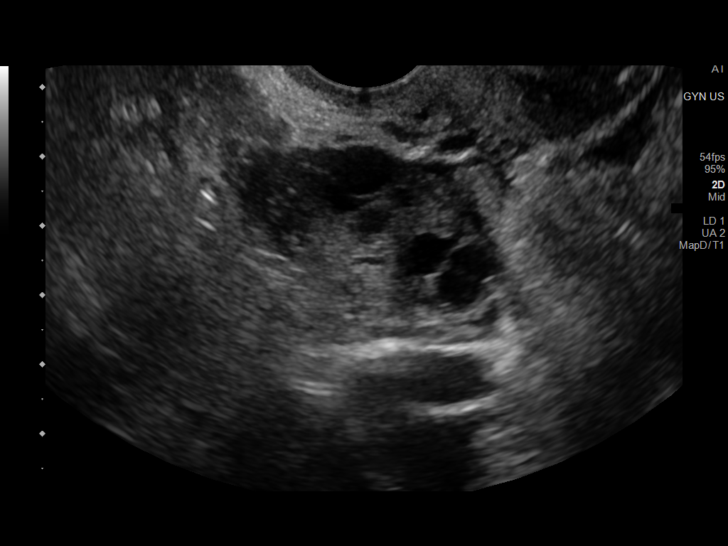
[im 33/40]
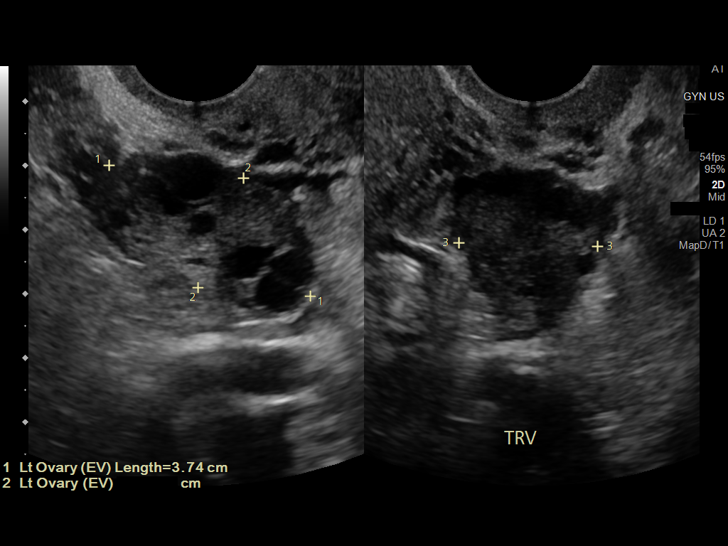
[im 36/40]
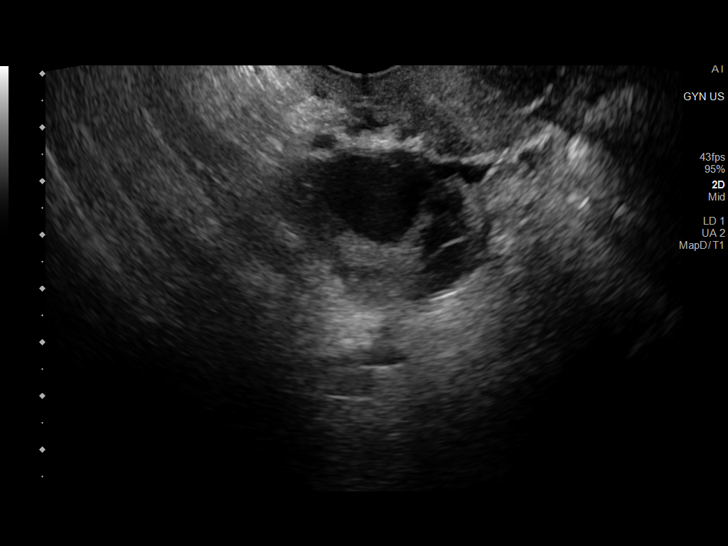
[im 40/40]
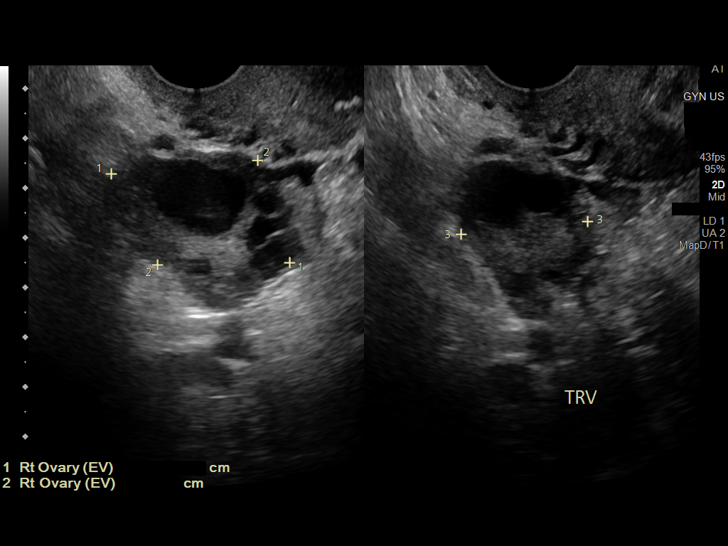

[14 of 25 positions shown; findings below may reference images not displayed]

FINDINGS: Uterus

Measurements: 9.4 x 5.3 x 5.7 cm = volume: 148 mL. The uterus is
anteverted. No intrauterine masses are seen. The cervix is
unremarkable.

Endometrium

Thickness: 7 mm.  No focal abnormality visualized.

Right ovary

Measurements: 4.0 x 2.9 x 2.6 cm = volume: 16 mL. Normal
appearance/no adnexal mass.

Left ovary

Measurements: 3.7 x 1.9 x 2.2 cm = volume: 8 mL. Normal
appearance/no adnexal mass.

Other findings

Trace free fluid is seen within the cul-de-sac, possibly
physiologic.
IMPRESSION: Normal pelvic sonogram.
